# Patient Record
Sex: Female | Born: 2000
Health system: Southern US, Community
[De-identification: ages and names within clinical notes are randomized; demographics above are authoritative.]

---
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
## 2018-02-23 ENCOUNTER — Ambulatory Visit: Payer: Medicaid Other

## 2018-03-02 ENCOUNTER — Ambulatory Visit (INDEPENDENT_AMBULATORY_CARE_PROVIDER_SITE_OTHER): Payer: 59

## 2018-03-02 DIAGNOSIS — Z3042 Encounter for surveillance of injectable contraceptive: Secondary | ICD-10-CM | POA: Diagnosis not present

## 2018-03-02 MED ORDER — MEDROXYPROGESTERONE ACETATE 150 MG/ML IM SUSP
150.0000 mg | Freq: Once | INTRAMUSCULAR | Status: AC
  Administered 2018-03-02: 150 mg via INTRAMUSCULAR

## 2018-05-25 ENCOUNTER — Ambulatory Visit (INDEPENDENT_AMBULATORY_CARE_PROVIDER_SITE_OTHER): Payer: 59

## 2018-05-25 ENCOUNTER — Other Ambulatory Visit: Payer: Self-pay

## 2018-05-25 DIAGNOSIS — Z3042 Encounter for surveillance of injectable contraceptive: Secondary | ICD-10-CM | POA: Diagnosis not present

## 2018-05-25 MED ORDER — MEDROXYPROGESTERONE ACETATE 150 MG/ML IM SUSP
150.0000 mg | Freq: Once | INTRAMUSCULAR | Status: AC
  Administered 2018-05-25: 150 mg via INTRAMUSCULAR

## 2018-08-17 ENCOUNTER — Ambulatory Visit: Payer: 59

## 2018-08-19 ENCOUNTER — Other Ambulatory Visit: Payer: Self-pay | Admitting: Obstetrics & Gynecology

## 2018-08-19 DIAGNOSIS — Z3042 Encounter for surveillance of injectable contraceptive: Secondary | ICD-10-CM

## 2020-03-09 ENCOUNTER — Encounter: Payer: Self-pay | Admitting: Emergency Medicine

## 2020-03-09 ENCOUNTER — Emergency Department: Payer: 59

## 2020-03-09 ENCOUNTER — Emergency Department
Admission: EM | Admit: 2020-03-09 | Discharge: 2020-03-09 | Disposition: A | Discharge status: Home / Self Care | Payer: 59 | Attending: Emergency Medicine | Admitting: Emergency Medicine

## 2020-03-09 ENCOUNTER — Other Ambulatory Visit: Payer: Self-pay

## 2020-03-09 DIAGNOSIS — W000XXA Fall on same level due to ice and snow, initial encounter: Secondary | ICD-10-CM | POA: Insufficient documentation

## 2020-03-09 DIAGNOSIS — S93401A Sprain of unspecified ligament of right ankle, initial encounter: Secondary | ICD-10-CM

## 2020-03-09 DIAGNOSIS — Y92169 Unspecified place in school dormitory as the place of occurrence of the external cause: Secondary | ICD-10-CM | POA: Insufficient documentation

## 2020-03-09 NOTE — ED Provider Notes (Signed)
[facility] Emergency Department Provider Note  ____________________________________________   Event Date/Time   First MD Initiated Contact with Patient 03/09/20 1627     (approximate)  I have reviewed the triage vital signs and the nursing notes.   HISTORY  Chief Complaint Ankle Pain  HPI Brenda Salazar is a 20 y.o. female reports to the emergency department for evaluation of acute right ankle injury.  Patient states that this morning at school, she was coming down some concrete steps when she slipped on ice and her right ankle twisted underneath of her.  She states that she heard and felt a pop on the outside of her right ankle.  She states that she has had a mixture of pain and numbness in it since that time.  She reports pain is significantly worse with any attempts at bearing weight.  Only alleviating measures attempted to this point have been ice.         History reviewed. No pertinent past medical history.  Patient Active Problem List   Diagnosis Date Noted  . Screen for STD (sexually transmitted disease) 06/12/2017  . Encounter for surveillance of injectable contraceptive 06/12/2017    History reviewed. No pertinent surgical history.  Prior to Admission medications   Medication Sig Start Date End Date Taking? Authorizing Provider  medroxyPROGESTERone (DEPO-PROVERA) 150 MG/ML injection Inject 1 mL (150 mg total) into the muscle every 3 (three) months. ANNUAL DUE 06/12/17   Nadara Mustard, MD    Allergies Patient has no known allergies.  Family History  Problem Relation Age of Onset  . Hypertension Mother   . Hypertension Maternal Grandmother   . Hypertension Maternal Grandfather   . Hypertension Paternal Grandmother   . Hypertension Paternal Grandfather   . Brain cancer Other   . Lung cancer Other   . Breast cancer Other 70  . Diabetes Maternal Aunt     Social History Social History   Tobacco Use  . Smoking status: Never  Smoker  . Smokeless tobacco: Never Used  Vaping Use  . Vaping Use: Never used  Substance Use Topics  . Alcohol use: Never  . Drug use: Never    Review of Systems Constitutional: No fever/chills Eyes: No visual changes. ENT: No sore throat. Cardiovascular: Denies chest pain. Respiratory: Denies shortness of breath. Gastrointestinal: No abdominal pain.  No nausea, no vomiting.  No diarrhea.  No constipation. Genitourinary: Negative for dysuria. Musculoskeletal: + Right ankle pain Skin: Negative for rash. Neurological: Negative for headaches, focal weakness or numbness.   ____________________________________________   PHYSICAL EXAM:  VITAL SIGNS: ED Triage Vitals  Enc Vitals Group     BP 03/09/20 1349 (!) 120/98     Pulse Rate 03/09/20 1349 89     Resp 03/09/20 1349 14     Temp 03/09/20 1349 98.6 F (37 C)     Temp Source 03/09/20 1349 Oral     SpO2 03/09/20 1349 100 %     Weight 03/09/20 1350 175 lb (79.4 kg)     Height 03/09/20 1350 5\' 3"  (1.6 m)     Head Circumference --      Peak Flow --      Pain Score 03/09/20 1350 8     Pain Loc --      Pain Edu? --      Excl. in GC? --     Constitutional: Alert and oriented. Well appearing and in no acute distress. Eyes: Conjunctivae are normal. PERRL. EOMI. Head:  Atraumatic. Nose: No congestion/rhinnorhea. Mouth/Throat: Mucous membranes are moist.   Neck: No stridor.   Cardiovascular: Normal rate, regular rhythm. Grossly normal heart sounds.  Good peripheral circulation. Respiratory: Normal respiratory effort.  No retractions. Lungs CTAB. Gastrointestinal: Soft and nontender. No distention. No abdominal bruits. No CVA tenderness. Musculoskeletal: There is ecchymosis present over the lateral aspect of the right ankle.  There is tenderness over the right distal fibula, and all of the lateral ligamentous structures.  There is minimal tenderness to palpation to the medial aspect of the ankle.  Dorsal pedal pulse 2+, capillary  refill less than 3 seconds all digits.  Significantly limited range of motion secondary to pain, patient able to wiggle toes appropriately.  She endorses some superficial numbness on the dorsum of her foot, otherwise feels the rest of her foot and ankle to palpation. Neurologic:  Normal speech and language. No gross focal neurologic deficits are appreciated.  Gait not assessed secondary to known right ankle injury. Skin:  Skin is warm, dry and intact. No rash noted. Psychiatric: Mood and affect are normal. Speech and behavior are normal.  ____________________________________________  RADIOLOGY I, Lucy Chris, personally viewed and evaluated these images (plain radiographs) as part of my medical decision making, as well as reviewing the written report by the radiologist.  ED provider interpretation: No acute fracture noted.  Official radiology report(s): DG Ankle Complete Right  Result Date: 03/09/2020 CLINICAL DATA:  Right ankle pain status post fall today. EXAM: RIGHT ANKLE - COMPLETE 3+ VIEW COMPARISON:  None. FINDINGS: There is no evidence of fracture, dislocation, or joint effusion. There is no evidence of arthropathy or other focal bone abnormality. Soft tissues are unremarkable. IMPRESSION: No acute abnormality of the right ankle Electronically Signed   By: Acquanetta Belling M.D.   On: 03/09/2020 14:31    ____________________________________________   INITIAL IMPRESSION / ASSESSMENT AND PLAN / ED COURSE  As part of my medical decision making, I reviewed the following data within the electronic MEDICAL RECORD NUMBER Nursing notes reviewed and incorporated, Radiograph reviewed and Notes from prior ED visits        Patient is a 20 year old female who presents to the emergency department for evaluation of acute right ankle injury that occurred when she slipped on some stairs this morning.  See HPI for further details.  On physical exam, the patient does have ecchymosis and soft tissue  swelling over the lateral aspect of the right ankle with tenderness to palpation primarily of the lateral aspect.  Range of motion significantly limited by pain.  X-rays were obtained and are negative for any acute fracture.  Differentials considered for the patient include lateral ankle sprain, syndesmosis sprain.  We will place the patient in a cam walker boot nonweightbearing and crutches.  We will have the patient follow-up with orthopedics and allow them to determine her next weightbearing status.  Work note provided.  Patient is stable at time for outpatient therapy, will return with acute worsening.      ____________________________________________   FINAL CLINICAL IMPRESSION(S) / ED DIAGNOSES  Final diagnoses:  Sprain of right ankle, unspecified ligament, initial encounter     ED Discharge Orders    None      *Please note:  Brenda Salazar was evaluated in Emergency Department on 03/09/2020 for the symptoms described in the history of present illness. She was evaluated in the context of the global COVID-19 pandemic, which necessitated consideration that the patient might be at risk for infection with  the SARS-CoV-2 virus that causes COVID-19. Institutional protocols and algorithms that pertain to the evaluation of patients at risk for COVID-19 are in a state of rapid change based on information released by regulatory bodies including the CDC and federal and state organizations. These policies and algorithms were followed during the patient's care in the ED.  Some ED evaluations and interventions may be delayed as a result of limited staffing during and the pandemic.*   Note:  This document was prepared using Dragon voice recognition software and may include unintentional dictation errors.   Lucy Chris, PA 03/09/20 Nida Boatman    Sharman Cheek, MD 03/10/20 [phone]

## 2020-03-09 NOTE — ED Triage Notes (Signed)
Says slipped on ice about noon today at school and injured right ankle.  Rolled it.  Says it feels painful at time and numb at times.  Good pedal pulse felt.  Has some swelling on outer ankle.

## 2020-03-09 NOTE — ED Notes (Signed)
See triage note  Presents with injury to right ankle and foot  S/p fall  Good pulses

## 2020-03-09 NOTE — Discharge Instructions (Signed)
Please use the CAM walker boot and crutches until follow up with orthopedics. You may alternate tylenol and ibuprofen for pain.

## 2020-07-25 ENCOUNTER — Emergency Department
Admission: EM | Admit: 2020-07-25 | Discharge: 2020-07-25 | Disposition: A | Discharge status: Home / Self Care | Payer: 59 | Attending: Emergency Medicine | Admitting: Emergency Medicine

## 2020-07-25 ENCOUNTER — Other Ambulatory Visit: Payer: Self-pay

## 2020-07-25 ENCOUNTER — Emergency Department: Payer: 59

## 2020-07-25 DIAGNOSIS — S99912A Unspecified injury of left ankle, initial encounter: Secondary | ICD-10-CM | POA: Diagnosis present

## 2020-07-25 DIAGNOSIS — S93432A Sprain of tibiofibular ligament of left ankle, initial encounter: Secondary | ICD-10-CM | POA: Diagnosis not present

## 2020-07-25 DIAGNOSIS — W010XXA Fall on same level from slipping, tripping and stumbling without subsequent striking against object, initial encounter: Secondary | ICD-10-CM | POA: Diagnosis not present

## 2020-07-25 DIAGNOSIS — Y93K1 Activity, walking an animal: Secondary | ICD-10-CM | POA: Insufficient documentation

## 2020-07-25 DIAGNOSIS — S93492A Sprain of other ligament of left ankle, initial encounter: Secondary | ICD-10-CM

## 2020-07-25 MED ORDER — MELOXICAM 15 MG PO TABS: 15.0000 mg | ORAL_TABLET | Freq: Every day | ORAL | 2 refills | Status: AC

## 2020-07-25 NOTE — Discharge Instructions (Addendum)
Take Meloxicam once daily for pain and inflammation.  Rest, ice compression and elevation.

## 2020-07-25 NOTE — ED Notes (Signed)
See triage note. Wheelchair to room. Swelling noted.

## 2020-07-25 NOTE — ED Triage Notes (Signed)
Pt tripped and fell and injured L ankle while outside walking dogs. C/o pain to lateral L ankle. A&O, in wheelchair. Denies hitting head or LOC.

## 2020-07-25 NOTE — ED Provider Notes (Signed)
[facility]-EMERGENCY DEPARTMENT  ____________________________________________  Time seen: Approximately 5:53 PM  I have reviewed the triage vital signs and the nursing notes.   HISTORY  Chief Complaint Ankle Pain   Historian Patient     HPI Brenda Salazar is a 20 y.o. female presents to the emergency department with left lateral and medial ankle pain after an inversion type ankle injury.  Patient states that she has had difficulty bearing weight since injury occurred.  She has sprained her right ankle in the past but not the left.  No numbness or tingling of the left lower extremity.   History reviewed. No pertinent past medical history.   Immunizations up to date:  Yes.     History reviewed. No pertinent past medical history.  Patient Active Problem List   Diagnosis Date Noted   Screen for STD (sexually transmitted disease) 06/12/2017   Encounter for surveillance of injectable contraceptive 06/12/2017    History reviewed. No pertinent surgical history.  Prior to Admission medications   Medication Sig Start Date End Date Taking? Authorizing Provider  meloxicam (MOBIC) 15 MG tablet Take 1 tablet (15 mg total) by mouth daily. 07/25/20 07/25/21 Yes Pia Mau M, PA-C  medroxyPROGESTERone (DEPO-PROVERA) 150 MG/ML injection Inject 1 mL (150 mg total) into the muscle every 3 (three) months. ANNUAL DUE 06/12/17   Nadara Mustard, MD    Allergies Patient has no known allergies.  Family History  Problem Relation Age of Onset   Hypertension Mother    Hypertension Maternal Grandmother    Hypertension Maternal Grandfather    Hypertension Paternal Grandmother    Hypertension Paternal Grandfather    Brain cancer Other    Lung cancer Other    Breast cancer Other 71   Diabetes Maternal Aunt     Social History Social History   Tobacco Use   Smoking status: Never   Smokeless tobacco: Never  Vaping Use   Vaping Use: Every day  Substance Use Topics   Alcohol use: Never    Drug use: Never     Review of Systems  Constitutional: No fever/chills Eyes:  No discharge ENT: No upper respiratory complaints. Respiratory: no cough. No SOB/ use of accessory muscles to breath Gastrointestinal:   No nausea, no vomiting.  No diarrhea.  No constipation. Musculoskeletal: Patient has left ankle pain.  Skin: Negative for rash, abrasions, lacerations, ecchymosis.  ____________________________________________   PHYSICAL EXAM:  VITAL SIGNS: ED Triage Vitals  Enc Vitals Group     BP 07/25/20 1550 132/89     Pulse Rate 07/25/20 1550 91     Resp 07/25/20 1550 18     Temp 07/25/20 1550 98.9 F (37.2 C)     Temp Source 07/25/20 1550 Oral     SpO2 07/25/20 1550 98 %     Weight 07/25/20 1551 170 lb (77.1 kg)     Height 07/25/20 1551 5\' 3"  (1.6 m)     Head Circumference --      Peak Flow --      Pain Score 07/25/20 1556 8     Pain Loc --      Pain Edu? --      Excl. in GC? --      Constitutional: Alert and oriented. Well appearing and in no acute distress. Eyes: Conjunctivae are normal. PERRL. EOMI. Head: Atraumatic. ENT: Cardiovascular: Normal rate, regular rhythm. Normal S1 and S2.  Good peripheral circulation. Respiratory: Normal respiratory effort without tachypnea or retractions. Lungs CTAB. Good air entry to  the bases with no decreased or absent breath sounds Gastrointestinal: Bowel sounds x 4 quadrants. Soft and nontender to palpation. No guarding or rigidity. No distention. Musculoskeletal: Full range of motion to all extremities. No obvious deformities noted.  Patient has tenderness to palpation over the anterior and posterior talofibular ligaments of the deltoid ligament.  Palpable dorsalis pedis pulse bilaterally and symmetrically. Neurologic:  Normal for age. No gross focal neurologic deficits are appreciated.  Skin:  Skin is warm, dry and intact. No rash noted. Psychiatric: Mood and affect are normal for age. Speech and behavior are normal.    ____________________________________________   LABS (all labs ordered are listed, but only abnormal results are displayed)  Labs Reviewed - No data to display ____________________________________________  EKG   ____________________________________________  RADIOLOGY Geraldo Pitter, personally viewed and evaluated these images (plain radiographs) as part of my medical decision making, as well as reviewing the written report by the radiologist.  DG Ankle Complete Left  Result Date: 07/25/2020 CLINICAL DATA:  Status post fall. EXAM: LEFT ANKLE COMPLETE - 3+ VIEW COMPARISON:  None. FINDINGS: There is no evidence of fracture, dislocation, or joint effusion. There is no evidence of arthropathy or other focal bone abnormality. Mild anterior and lateral soft tissue swelling is seen. IMPRESSION: Mild soft tissue swelling without an acute osseous abnormality. Electronically Signed   By: Aram Candela M.D.   On: 07/25/2020 16:23    ____________________________________________    PROCEDURES  Procedure(s) performed:     Procedures     Medications - No data to display   ____________________________________________   INITIAL IMPRESSION / ASSESSMENT AND PLAN / ED COURSE  Pertinent labs & imaging results that were available during my care of the patient were reviewed by me and considered in my medical decision making (see chart for details).      Assessment and plan Ankle sprain 20 year old female presents to the emergency department with ankle pain after an inversion type ankle injury.  No bony abnormalities were visualized on x-ray.  Patient has tenderness to palpation of the anterior and posterior talofibular ligaments.  An Ace wrap was applied and crutches were given.  Rest, ice, compression elevation were recommended.  Return precautions were given to return with new or worsening symptoms.     ____________________________________________  FINAL CLINICAL  IMPRESSION(S) / ED DIAGNOSES  Final diagnoses:  Sprain of anterior talofibular ligament of left ankle, initial encounter      NEW MEDICATIONS STARTED DURING THIS VISIT:  ED Discharge Orders          Ordered    Crutches  Status:  Canceled        07/25/20 1735    Apply wrap       Comments: Left   07/25/20 1735    meloxicam (MOBIC) 15 MG tablet  Daily        07/25/20 1737                This chart was dictated using voice recognition software/Dragon. Despite best efforts to proofread, errors can occur which can change the meaning. Any change was purely unintentional.     Orvil Feil, PA-C 07/25/20 1756    Concha Se, MD 07/26/20 [phone]

## 2022-03-25 IMAGING — CR DG ANKLE COMPLETE 3+V*L*
1 series · 3 of 3 positions shown · non-contrast
Comparison: None.

CLINICAL DATA: Status post fall.

EXAM:
LEFT ANKLE COMPLETE - 3+ VIEW

[Series 1: dg ankle complete left · 0.14mm/px · 3 of 3 slices shown]
[im 1/3]
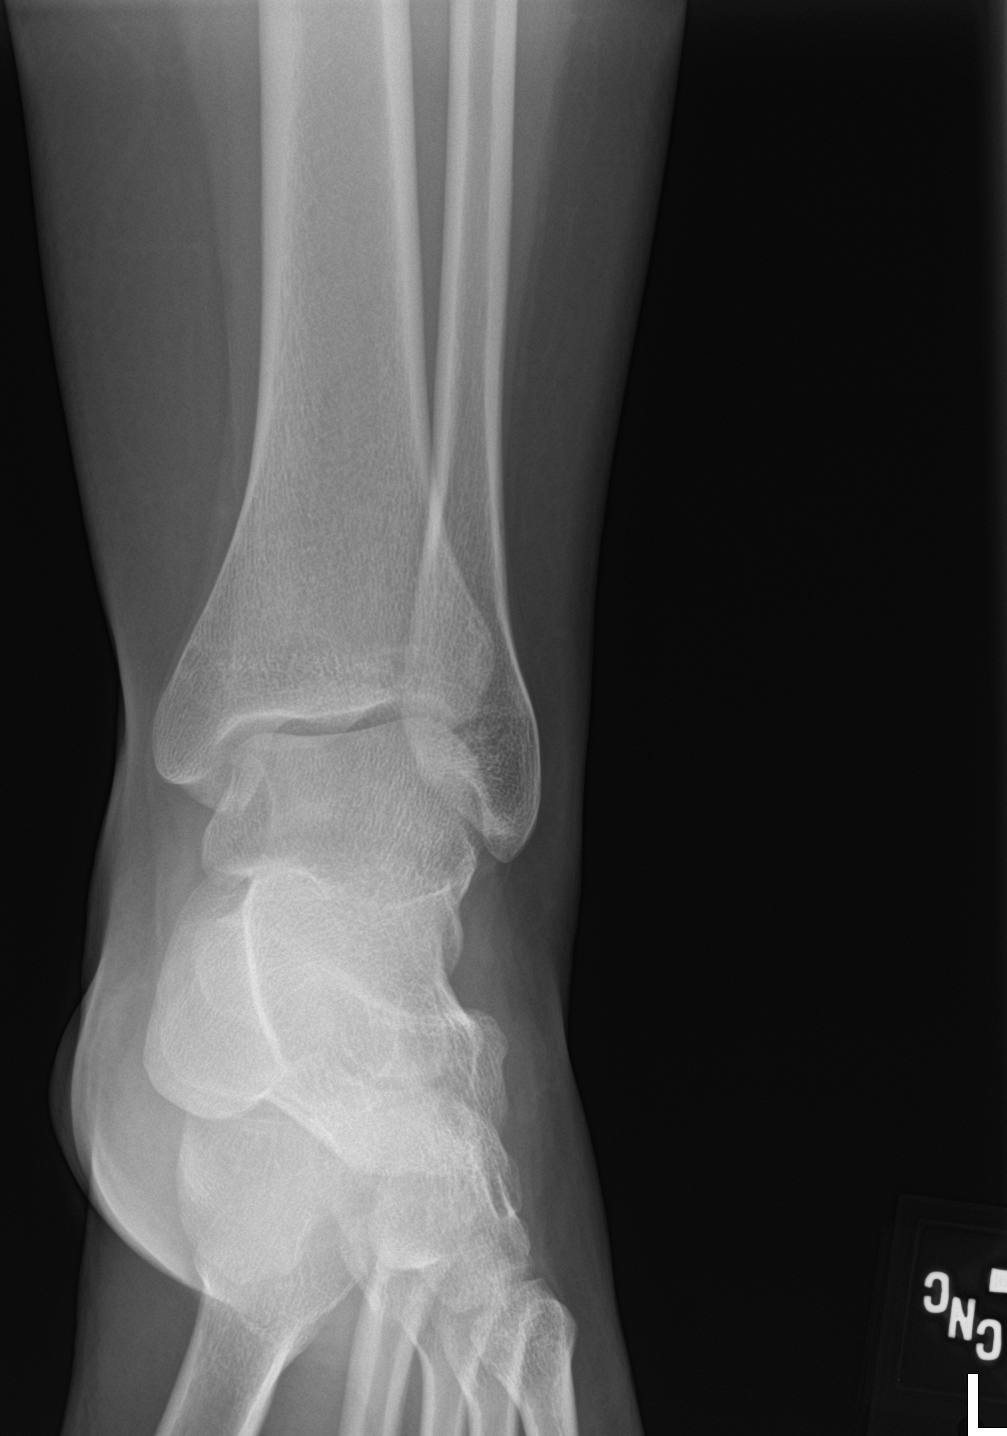
[im 2/3]
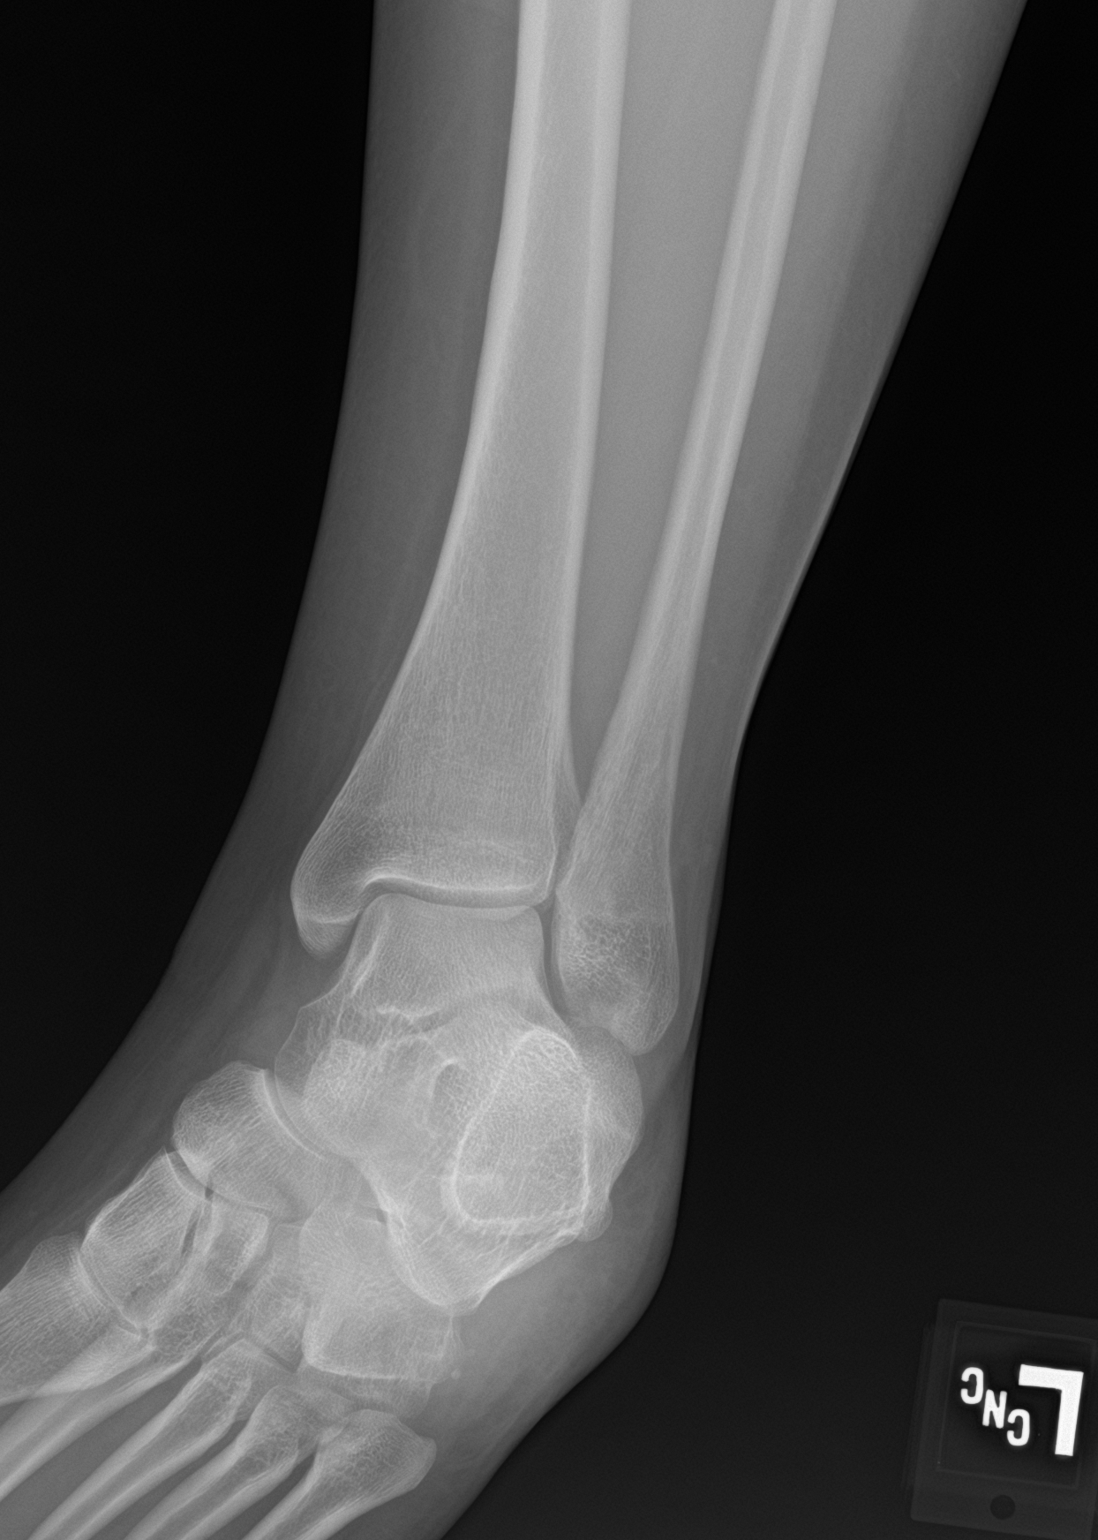
[im 3/3]
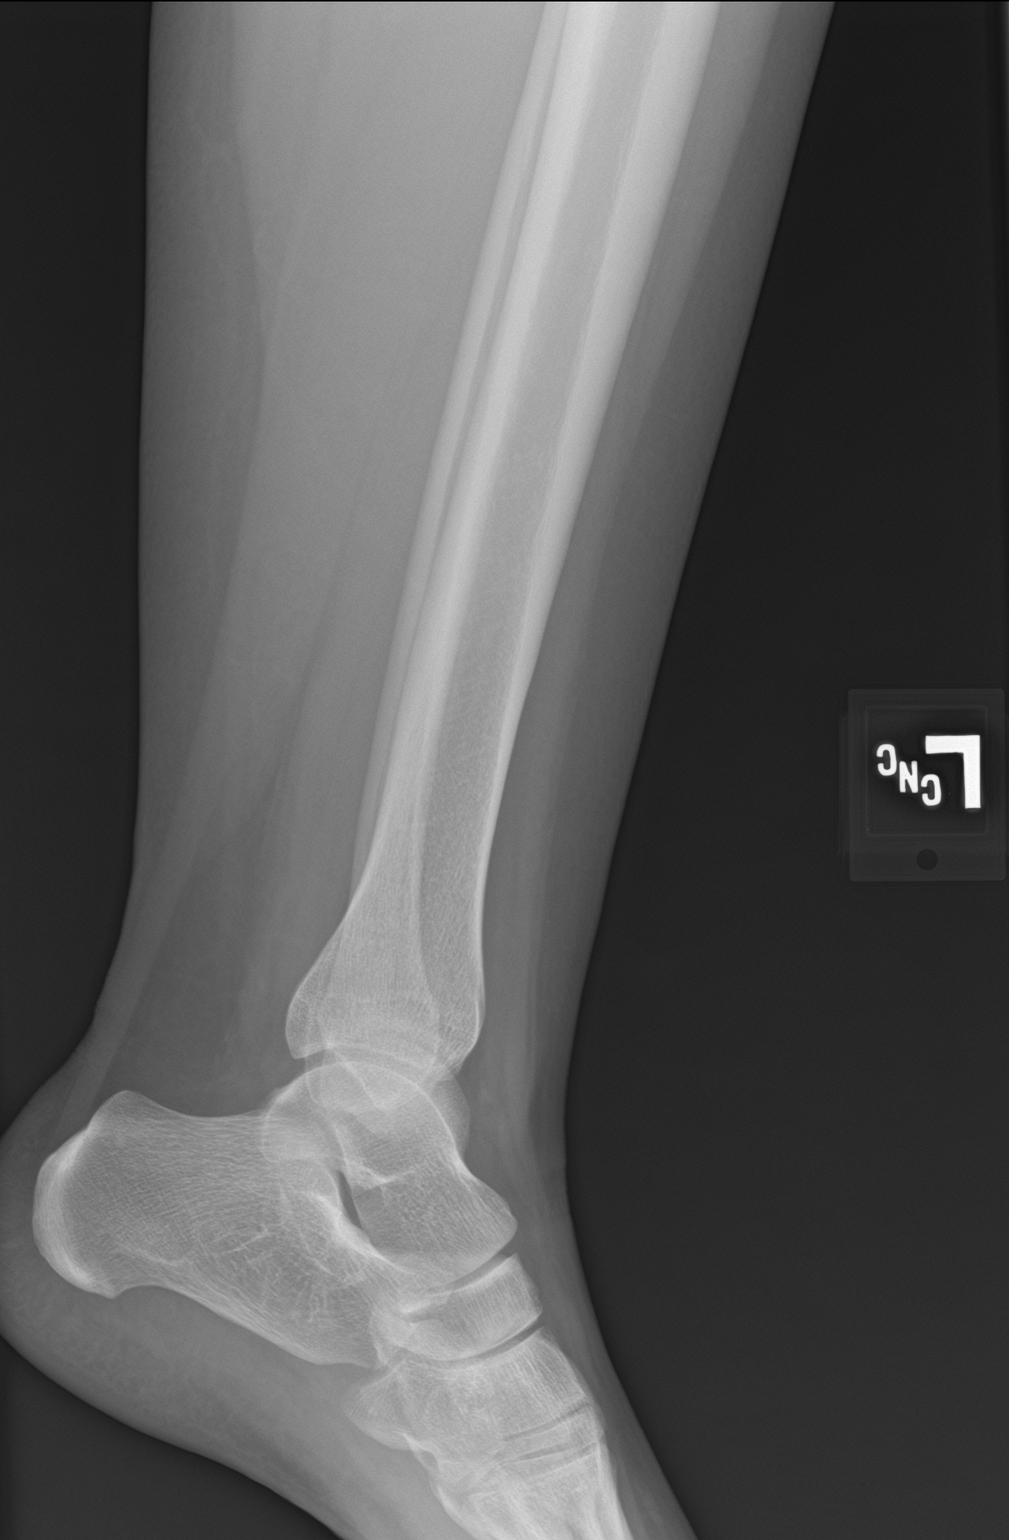

[3 of 3 positions shown; findings below may reference images not displayed]

FINDINGS: There is no evidence of fracture, dislocation, or joint effusion.
There is no evidence of arthropathy or other focal bone abnormality.
Mild anterior and lateral soft tissue swelling is seen.
IMPRESSION: Mild soft tissue swelling without an acute osseous abnormality.

## 2022-12-28 ENCOUNTER — Encounter: Payer: Self-pay | Admitting: Obstetrics

## 2022-12-28 ENCOUNTER — Ambulatory Visit (INDEPENDENT_AMBULATORY_CARE_PROVIDER_SITE_OTHER): Payer: 59 | Admitting: Obstetrics

## 2022-12-28 ENCOUNTER — Other Ambulatory Visit (HOSPITAL_COMMUNITY)
Admission: RE | Admit: 2022-12-28 | Discharge: 2022-12-28 | Disposition: A | Discharge status: Home / Self Care | Payer: 59 | Source: Ambulatory Visit | Attending: Obstetrics | Admitting: Obstetrics

## 2022-12-28 VITALS — BP 110/70 | HR 80 | Ht 64.0 in | Wt 146.0 lb

## 2022-12-28 DIAGNOSIS — Z01419 Encounter for gynecological examination (general) (routine) without abnormal findings: Secondary | ICD-10-CM | POA: Diagnosis present

## 2022-12-28 DIAGNOSIS — Z124 Encounter for screening for malignant neoplasm of cervix: Secondary | ICD-10-CM

## 2022-12-28 NOTE — Patient Instructions (Signed)

## 2022-12-28 NOTE — Progress Notes (Signed)
GYNECOLOGY: ANNUAL EXAM   Subjective:    PCP: Pcp, No Brenda Salazar is a 22 y.o. female G0P0000 who presents for annual wellness visit.   Well Woman Visit:  GYN HISTORY:  Patient's last menstrual period was 12/05/2022.     Menstrual History: OB History     Gravida  0   Para  0   Term  0   Preterm  0   AB  0   Living  0      SAB  0   IAB  0   Ectopic  0   Multiple  0   Live Births  0           Menarche age: 68 Patient's last menstrual period was 12/05/2022. Period Cycle (Days): 30 Period Duration (Days): 4-5 Period Pattern: Regular Menstrual Flow: Moderate Dysmenorrhea: (!) Moderate Dysmenorrhea Symptoms: Cramping   Periods are every 30 days, and last 4-5 days, flow is light / moderate .  Uses pads / tampons / menstrual cup and changes it every 3-4 hours.  Cramping is mild / moderate .  Cyclic symptoms include: bloating.  Intermenstrual bleeding, spotting, or discharge? No  Urinary incontinence? A little  Sexually active: yes Number of sexual partners: one  Gender of sexual Partners: male Social History   Substance and Sexual Activity  Sexual Activity Yes   Birth control/protection: None   Contraceptive methods: no method Dyspareunia? No  STI history: NO  STI/HIV testing or immunizations needed? No.   Health Maintenance: -Last pap: was normal 3 years ago --> Any abnormals: NO  -FMH of Breast / Colon / Cervical cancer: MGM breast  -Vaccines:  There is no immunization history on file for this patient. Last Tdap: declind  / Flu: declined  / COVID: declined  / Gardasil: declined  / Shingles (50+): n/a / PCV20:  -Hep C screen: not today  -Last lipid / glucose screening: PCP   > Exercise: walking and weightlifting, very active > Dietary Supplements: Folate: No;  Calcium: No; Vitamin D: No > Body mass index is 25.06 kg/m.  > Recent dental visit No. > Seat Belt Use: Yes.   > Texting and driving? No. > Guns in the house Yes.   >  Recreational or other drug use: denied.   Social History   Tobacco Use   Smoking status: Never   Smokeless tobacco: Never  Substance Use Topics   Alcohol use: Never   Occupation: Data processing manager      Lives with: boyfriend     PHQ-2 Score: In last two weeks, how often have you felt: Little interest or pleasure in doing things: Not at all (0) Feeling down, depressed or hopeless: Not at all (0) Score: 0  GAD-2 Over the last 2 weeks, how often have you been bothered by the following problems? Feeling nervous, anxious or on edge: Not at all (0) Not being able to stop or control worrying: Not at all (0)} Score: 0 _________________________________________________________  Current Outpatient Medications  Medication Sig Dispense Refill   medroxyPROGESTERone (DEPO-PROVERA) 150 MG/ML injection Inject 1 mL (150 mg total) into the muscle every 3 (three) months. ANNUAL DUE (Patient not taking: Reported on 12/28/2022) 1 mL 3   No current facility-administered medications for this visit.   No Known Allergies  History reviewed. No pertinent past medical history. History reviewed. No pertinent surgical history.  Review Of Systems  Constitutional: Denied constitutional symptoms, night sweats, recent illness, fatigue, fever, insomnia and weight loss.  Eyes: Denied eye symptoms, eye pain, photophobia, vision change and visual disturbance.  Ears/Nose/Throat/Neck: Denied ear, nose, throat or neck symptoms, hearing loss, nasal discharge, sinus congestion and sore throat.  Cardiovascular: Denied cardiovascular symptoms, arrhythmia, chest pain/pressure, edema, exercise intolerance, orthopnea and palpitations.  Respiratory: Denied pulmonary symptoms, asthma, pleuritic pain, productive sputum, cough, dyspnea and wheezing.  Gastrointestinal: Denied, gastro-esophageal reflux, melena, nausea and vomiting.  Genitourinary: Denied genitourinary symptoms including symptomatic vaginal discharge, pelvic  relaxation issues, and urinary complaints.  Musculoskeletal: Denied musculoskeletal symptoms, stiffness, swelling, muscle weakness and myalgia.  Dermatologic: Denied dermatology symptoms, rash and scar.  Neurologic: Denied neurology symptoms, dizziness, headache, neck pain and syncope.  Psychiatric: Denied psychiatric symptoms, anxiety and depression.  Endocrine: Denied endocrine symptoms including hot flashes and night sweats.      Objective:    BP 110/70   Pulse 80   Ht 5\' 4"  (1.626 m)   Wt 146 lb (66.2 kg)   LMP 12/05/2022   BMI 25.06 kg/m   Constitutional: Well-developed, well-nourished female in no acute distress Neurological: Alert and oriented to person, place, and time Psychiatric: Mood and affect appropriate Skin: No rashes or lesions Neck: Supple without masses. Trachea is midline.Thyroid is normal size without masses Lymphatics: No cervical, axillary, supraclavicular, or inguinal adenopathy noted Respiratory: Clear to auscultation bilaterally. Good air movement with normal work of breathing. Cardiovascular: Regular rate and rhythm. Extremities grossly normal, nontender with no edema; pulses regular Gastrointestinal: Soft, nontender, nondistended. No masses or hernias appreciated. No hepatosplenomegaly. No fluid wave. No rebound or guarding. Breast Exam: normal appearance, no masses or tenderness, Inspection negative, No nipple retraction or dimpling, No nipple discharge or bleeding, No axillary or supraclavicular adenopathy, Normal to palpation without dominant masses Genitourinary:         External Genitalia: Normal female genitalia    Vagina: Normal mucosa, no lesions.    Cervix: No lesions, normal size and consistency; no cervical motion tenderness; non-friable; Pap obtained.    Uterus: Normal size and contour; smooth, mobile, NT. Adnexae: Non-palpable and non-tender Perineum/Anus: No lesions Rectal: deferred    Assessment/Plan:    Brenda Salazar is a 22 y.o.  female G0P0000 with normal well-woman gynecologic exam.  -Screenings:  Pap: w/rflx today Labs: per PCP GAD/PHQ-2 = 0 -Contraception: declines -Vaccines: declines all -Healthy lifestyle modifications discussed: multivitamin, diet, exercise, sunscreen, tobacco and alcohol use. Emphasized importance of regular physical activity.  -Folate recommendation reviewed.  -All questions answered to patient's satisfaction.   Return in about 1 year (around 12/28/2023) for annual.   Julieanne Manson, DO [facility]

## 2023-01-02 LAB — CYTOLOGY - PAP
Chlamydia: NEGATIVE
Comment: NEGATIVE
Comment: NORMAL
Diagnosis: NEGATIVE
Neisseria Gonorrhea: NEGATIVE

## 2023-09-06 ENCOUNTER — Ambulatory Visit

## 2023-09-06 VITALS — BP 116/71 | HR 83 | Ht 64.0 in | Wt 155.0 lb

## 2023-09-06 DIAGNOSIS — N912 Amenorrhea, unspecified: Secondary | ICD-10-CM

## 2023-09-06 DIAGNOSIS — N926 Irregular menstruation, unspecified: Secondary | ICD-10-CM

## 2023-09-06 DIAGNOSIS — Z3201 Encounter for pregnancy test, result positive: Secondary | ICD-10-CM

## 2023-09-06 LAB — POCT URINE PREGNANCY: Preg Test, Ur: POSITIVE — AB

## 2023-09-06 NOTE — Progress Notes (Signed)
    NURSE VISIT NOTE  Subjective:    Patient ID: Brenda Salazar, female    DOB: April 27, 2000, 23 y.o.   MRN: [id number]  HPI  Patient is a 23 y.o. G45P0000 female who presents for evaluation of amenorrhea. She believes she could be pregnant. Pregnancy is desired. Sexual Activity: single partner, contraception: none. Current symptoms also include: breast tenderness, fatigue, frequent urination, and positive home pregnancy test. Last period was normal.    Objective:    BP 116/71   Pulse 83   Ht 5' 4 (1.626 m)   Wt 155 lb (70.3 kg)   LMP 07/29/2023   BMI 26.61 kg/m   Lab Review  Results for orders placed or performed in visit on 09/06/23  POCT urine pregnancy  Result Value Ref Range   Preg Test, Ur Positive (A) Negative    Assessment:   1. Missed period     Plan:   Pregnancy Test: Positive  Estimated Date of Delivery:05/04/24 BP Cuff Measurement taken. Cuff Size Adult Small Encouraged well-balanced diet, plenty of rest when needed, pre-natal vitamins daily and walking for exercise.  Discussed self-help for nausea, avoiding OTC medications until consulting provider or pharmacist, other than Tylenol as needed, minimal caffeine (1-2 cups daily) and avoiding alcohol.   She will schedule her nurse visit @ 7-[redacted] wks pregnant, u/s for dating @10  wk, and NOB visit at [redacted] wk pregnant.    Feel free to call with any questions.     Harlene Gander, CMA

## 2023-09-11 ENCOUNTER — Other Ambulatory Visit: Payer: Self-pay

## 2023-09-11 ENCOUNTER — Telehealth: Payer: Self-pay

## 2023-09-11 DIAGNOSIS — O209 Hemorrhage in early pregnancy, unspecified: Secondary | ICD-10-CM

## 2023-09-11 NOTE — Telephone Encounter (Signed)
 Pt called back to report heavier bleeding. Beta ordered, she will come in tomorrow at 9am then repeat in 48 hours.

## 2023-09-11 NOTE — Telephone Encounter (Signed)
 Patient called in at [redacted]w[redacted]d with concerns of light pink spotting. She states it has only happened once, denies pain or cramping at this time. Discussed implantation spotting, vaginal irritation and recent intercourse causing her to possibly spot. Advised to monitor spotting and call if bleeding begins to become heavier, red or she begins to have pain.

## 2023-09-12 ENCOUNTER — Other Ambulatory Visit

## 2023-09-12 DIAGNOSIS — O209 Hemorrhage in early pregnancy, unspecified: Secondary | ICD-10-CM

## 2023-09-13 LAB — BETA HCG QUANT (REF LAB): hCG Quant: 274 m[IU]/mL

## 2023-09-14 ENCOUNTER — Other Ambulatory Visit: Payer: Self-pay

## 2023-09-14 ENCOUNTER — Encounter: Payer: Self-pay | Admitting: Emergency Medicine

## 2023-09-14 ENCOUNTER — Emergency Department

## 2023-09-14 ENCOUNTER — Emergency Department
Admission: EM | Admit: 2023-09-14 | Discharge: 2023-09-14 | Disposition: A | Discharge status: Home / Self Care | Attending: Emergency Medicine | Admitting: Emergency Medicine

## 2023-09-14 DIAGNOSIS — Z3A01 Less than 8 weeks gestation of pregnancy: Secondary | ICD-10-CM | POA: Insufficient documentation

## 2023-09-14 DIAGNOSIS — O4690 Antepartum hemorrhage, unspecified, unspecified trimester: Secondary | ICD-10-CM

## 2023-09-14 DIAGNOSIS — O209 Hemorrhage in early pregnancy, unspecified: Secondary | ICD-10-CM

## 2023-09-14 DIAGNOSIS — O039 Complete or unspecified spontaneous abortion without complication: Secondary | ICD-10-CM | POA: Insufficient documentation

## 2023-09-14 LAB — CBC WITH DIFFERENTIAL/PLATELET
Abs Immature Granulocytes: 0.02 K/uL (ref 0.00–0.07)
Basophils Absolute: 0 K/uL (ref 0.0–0.1)
Basophils Relative: 1 %
Eosinophils Absolute: 0.1 K/uL (ref 0.0–0.5)
Eosinophils Relative: 1 %
HCT: 43.5 % (ref 36.0–46.0)
Hemoglobin: 14.7 g/dL (ref 12.0–15.0)
Immature Granulocytes: 0 %
Lymphocytes Relative: 26 %
Lymphs Abs: 2 K/uL (ref 0.7–4.0)
MCH: 31.9 pg (ref 26.0–34.0)
MCHC: 33.8 g/dL (ref 30.0–36.0)
MCV: 94.4 fL (ref 80.0–100.0)
Monocytes Absolute: 0.5 K/uL (ref 0.1–1.0)
Monocytes Relative: 7 %
Neutro Abs: 5.1 K/uL (ref 1.7–7.7)
Neutrophils Relative %: 65 %
Platelets: 275 K/uL (ref 150–400)
RBC: 4.61 MIL/uL (ref 3.87–5.11)
RDW: 11.9 % (ref 11.5–15.5)
WBC: 7.8 K/uL (ref 4.0–10.5)
nRBC: 0 % (ref 0.0–0.2)

## 2023-09-14 LAB — COMPREHENSIVE METABOLIC PANEL WITH GFR
ALT: 25 U/L (ref 0–44)
AST: 22 U/L (ref 15–41)
Albumin: 4.4 g/dL (ref 3.5–5.0)
Alkaline Phosphatase: 39 U/L (ref 38–126)
Anion gap: 9 (ref 5–15)
BUN: 14 mg/dL (ref 6–20)
CO2: 23 mmol/L (ref 22–32)
Calcium: 9.4 mg/dL (ref 8.9–10.3)
Chloride: 105 mmol/L (ref 98–111)
Creatinine, Ser: 0.8 mg/dL (ref 0.44–1.00)
GFR, Estimated: >60 mL/min (ref >60)
Glucose, Bld: 96 mg/dL (ref 70–99)
Potassium: 3.9 mmol/L (ref 3.5–5.1)
Sodium: 137 mmol/L (ref 135–145)
Total Bilirubin: 1.3 mg/dL — ABNORMAL HIGH (ref 0.0–1.2)
Total Protein: 7.8 g/dL (ref 6.5–8.1)

## 2023-09-14 LAB — URINALYSIS, ROUTINE W REFLEX MICROSCOPIC
Bilirubin Urine: NEGATIVE
Glucose, UA: NEGATIVE mg/dL
Ketones, ur: 5 mg/dL — AB
Leukocytes,Ua: NEGATIVE
Nitrite: NEGATIVE
Protein, ur: NEGATIVE mg/dL
Specific Gravity, Urine: 1.019 (ref 1.005–1.030)
pH: 5 (ref 5.0–8.0)

## 2023-09-14 LAB — HCG, QUANTITATIVE, PREGNANCY: hCG, Beta Chain, Quant, S: 94 m[IU]/mL — ABNORMAL HIGH (ref <5)

## 2023-09-14 LAB — ABO/RH: ABO/RH(D): A POS

## 2023-09-14 NOTE — Discharge Instructions (Addendum)
 Please continue to go to your scheduled appointment tomorrow with your OBGYN for further discussion and management. Your Ultrasound performed today showed no evidence of a intrauterine pregnancy.  Get plenty of rest.  For pain,  alternate Tylenol or ibuprofen as needed.

## 2023-09-14 NOTE — ED Triage Notes (Signed)
 Presents with family with some vaginal bleeding   States she has had some spotting yesterday  But bleeding became heavier this am   Also states she passed a clot this morning  Pt is [redacted] weeks pregnant

## 2023-09-14 NOTE — ED Provider Notes (Signed)
 [facility] Emergency Department Provider Note     Event Date/Time   First MD Initiated Contact with Patient 09/14/23 1154     (approximate)   History   Vaginal Bleeding   HPI  Brenda Salazar is a 23 y.o. female G1P0 approximately [redacted] weeks pregnant presents to the ED for evaluation of vaginal bleeding x 3 days.  Patient reports bleeding has become heavier and she has passed a blood clot this morning.  Associated symptoms includes lower abdominal cramping. Denies fever, urinary symptoms, and vomiting.   Chart reviewed - patient had beta hcg drawn 08/12. Nurse visit encounter on 08/06 for missed period. Telephone encounter 08/11 for vaginal spotting needing repeat beta hcg in 48 hours (today).     Physical Exam   Triage Vital Signs: ED Triage Vitals  Encounter Vitals Group     BP 09/14/23 1149 114/78     Girls Systolic BP Percentile --      Girls Diastolic BP Percentile --      Boys Systolic BP Percentile --      Boys Diastolic BP Percentile --      Pulse Rate 09/14/23 1149 86     Resp 09/14/23 1149 18     Temp 09/14/23 1149 98.3 F (36.8 C)     Temp Source 09/14/23 1149 Oral     SpO2 09/14/23 1149 98 %     Weight 09/14/23 1148 154 lb 15.7 oz (70.3 kg)     Height 09/14/23 1148 5' 4 (1.626 m)     Head Circumference --      Peak Flow --      Pain Score 09/14/23 1147 0     Pain Loc --      Pain Education --      Exclude from Growth Chart --     Most recent vital signs: Vitals:   09/14/23 1149 09/14/23 1620  BP: 114/78 119/78  Pulse: 86 90  Resp: 18 18  Temp: 98.3 F (36.8 C) 98.3 F (36.8 C)  SpO2: 98% 99%   General Awake, no distress.  HEENT NCAT.  CV:  Good peripheral perfusion. RESP:  Normal effort.  ABD:  No distention. Soft, non tender to palpation.   ED Results / Procedures / Treatments   Labs (all labs ordered are listed, but only abnormal results are displayed) Labs Reviewed  COMPREHENSIVE METABOLIC PANEL WITH GFR -  Abnormal; Notable for the following components:      Result Value   Total Bilirubin 1.3 (*)    All other components within normal limits  HCG, QUANTITATIVE, PREGNANCY - Abnormal; Notable for the following components:   hCG, Beta Chain, Quant, S 94 (*)    All other components within normal limits  URINALYSIS, ROUTINE W REFLEX MICROSCOPIC - Abnormal; Notable for the following components:   Color, Urine YELLOW (*)    APPearance TURBID (*)    Hgb urine dipstick MODERATE (*)    Ketones, ur 5 (*)    Bacteria, UA MANY (*)    All other components within normal limits  CBC WITH DIFFERENTIAL/PLATELET  ABO/RH   RADIOLOGY  I personally viewed and evaluated these images as part of my medical decision making, as well as reviewing the written report by the radiologist.  ED Provider Interpretation: No obvious IUP noted will confirm with final radiology read  US  OB LESS THAN 14 WEEKS WITH OB TRANSVAGINAL Result Date: 09/14/2023 CLINICAL DATA:  Vaginal bleeding. EXAM: OBSTETRIC <14 WK  US  AND TRANSVAGINAL OB US  TECHNIQUE: Both transabdominal and transvaginal ultrasound examinations were performed for complete evaluation of the gestation as well as the maternal uterus, adnexal regions, and pelvic cul-de-sac. Transvaginal technique was performed to assess early pregnancy. COMPARISON:  None Available. FINDINGS: Intrauterine gestational sac: None Yolk sac:  Not Visualized. Embryo:  Not Visualized. Cardiac Activity: Not Visualized. Heart Rate: N/A  bpm Maternal uterus/adnexae: The endometrium measures 5.8 mm in thickness. The right ovary measures 3.9 cm x 2.3 cm x 2.2 cm and is normal in appearance. The left ovary measures 3.3 cm x 2.2 cm x 1.9 cm and contains 2.2 cm x 1.1 cm x 1.6 cm and 1.2 cm x 0.7 cm x 0.8 cm left ovarian cysts. No pelvic free fluid is noted. IMPRESSION: No evidence of an intrauterine pregnancy. Correlation with follow-up pelvic ultrasound and serial beta HCG levels is recommended if this  remains of clinical concern. Electronically Signed   By: Suzen Dials M.D.   On: 09/14/2023 16:20   PROCEDURES:  Critical Care performed: No  Procedures  MEDICATIONS ORDERED IN ED: Medications - No data to display  IMPRESSION / MDM / ASSESSMENT AND PLAN / ED COURSE  I reviewed the triage vital signs and the nursing notes.                              Clinical Course as of 09/14/23 1714  Thu Sep 14, 2023  1332 CBC with Differential Wnl. Hgb stable  [MH]  1332 Comprehensive metabolic panel(!) wnl [MH]  1332 hCG, quantitative, pregnancy(!) 94 --> hcg 2 days ago 274 [MH]  1623 US  OB LESS THAN 14 WEEKS WITH OB TRANSVAGINAL IMPRESSION: No evidence of an intrauterine pregnancy. Correlation with follow-up pelvic ultrasound and serial beta HCG levels is recommended if this remains of clinical concern.   [MH]  1628 D/w Zelda Hummer, CNM. Patient has an appointment with  OBGYN tomorrow. They will follow up with patient in the clinic for serial beta hcg.  [MH]    Clinical Course User Index [MH] Margrette Monte A, PA-C    23 y.o. female presents to the emergency department for evaluation and treatment of vaginal bleeding during early pregnancy. See HPI for further details.   Differential diagnosis includes, but is not limited to, threatened miscarriage, incomplete miscarriage, normal bleeding from an early trimester pregnancy, ectopic pregnancy, , blighted ovum, vaginal/cervical trauma, subchorionic hemorrhage/hematoma, etc.   Patient's presentation is most consistent with acute complicated illness / injury requiring diagnostic workup.  Will obtain U/S for further evaluation.  Patient is otherwise alert and oriented and hemodynamically stable.  Physical exam findings are benign.  Nontender abdomen exam.  Urinalysis reveals no infectious etiology.  Patient is alert and oriented.  She is hemodynamically stable and in no pain on reevaluation.  Ultrasound results updated to  patient.  Please see clinical course.  Discussed with Zelda Hummer CNM who advised close follow-up in office for further management.  Patient states she has an appointment tomorrow, which is reassuring.  She is in stable condition for discharge home.  ED precautions discussed.   FINAL CLINICAL IMPRESSION(S) / ED DIAGNOSES   Final diagnoses:  Vaginal bleeding in pregnancy  Spontaneous miscarriage   Rx / DC Orders   ED Discharge Orders     None       Note:  This document was prepared using Dragon voice recognition software and may include unintentional dictation errors.  Margrette, Darnell Jeschke A, PA-C 09/14/23 1718    Jacolyn Pae, MD 09/14/23 [phone]

## 2023-09-15 ENCOUNTER — Other Ambulatory Visit

## 2023-09-15 ENCOUNTER — Telehealth

## 2023-09-16 LAB — BETA HCG QUANT (REF LAB): hCG Quant: 39 m[IU]/mL

## 2023-09-19 ENCOUNTER — Telehealth: Payer: Self-pay | Admitting: Obstetrics

## 2023-09-19 ENCOUNTER — Other Ambulatory Visit: Payer: Self-pay

## 2023-09-19 ENCOUNTER — Ambulatory Visit: Payer: Self-pay

## 2023-09-19 DIAGNOSIS — O209 Hemorrhage in early pregnancy, unspecified: Secondary | ICD-10-CM

## 2023-09-19 NOTE — Telephone Encounter (Signed)
 Contacted  the patient via phone. No answer, Left message for the patient to contact the office.

## 2023-09-19 NOTE — Telephone Encounter (Signed)
-----   Message from [facility] Waddell B sent at 09/19/2023  8:46 AM EDT ----- Regarding: lab appt PT needs another lab appt this Thursday or Friday for a beta please

## 2023-09-22 ENCOUNTER — Other Ambulatory Visit

## 2023-09-22 DIAGNOSIS — O209 Hemorrhage in early pregnancy, unspecified: Secondary | ICD-10-CM

## 2023-09-23 LAB — BETA HCG QUANT (REF LAB): hCG Quant: 2 m[IU]/mL

## 2023-09-25 ENCOUNTER — Ambulatory Visit: Payer: Self-pay | Admitting: Obstetrics

## 2023-10-16 ENCOUNTER — Other Ambulatory Visit

## 2023-10-23 ENCOUNTER — Encounter: Admitting: Obstetrics
